# Patient Record
Sex: Male | Born: 1990 | Race: Black or African American | Hispanic: No | Marital: Married | Smoking: Current every day smoker
Health system: Southern US, Community
[De-identification: ages and names within clinical notes are randomized; demographics above are authoritative.]

---
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
## 2009-12-18 ENCOUNTER — Emergency Department (HOSPITAL_COMMUNITY): Admission: EM | Admit: 2009-12-18 | Discharge: 2009-12-18 | Payer: Self-pay | Admitting: Emergency Medicine

## 2010-03-11 ENCOUNTER — Emergency Department: Payer: Self-pay | Admitting: Emergency Medicine

## 2011-02-07 IMAGING — CT CT HEAD WITHOUT CONTRAST
2 series · 16 of 30 positions shown, 20 images · non-contrast
Comparison: none

REASON FOR EXAM: trauma
COMMENTS:

PROCEDURE:     CT  - CT HEAD WITHOUT CONTRAST  - March 11, 2010  [DATE]
RESULT:
TECHNIQUE: Noncontrast, emergent CT of the brain is performed in the
standard fashion.
There is no previous exam for comparison.

[Series 2: without · axial · non-contrast · 0.43mm/px · z∈[-170,-24]mm · 13 of 35 slices shown, 17 images]
[im 3/35  brain]
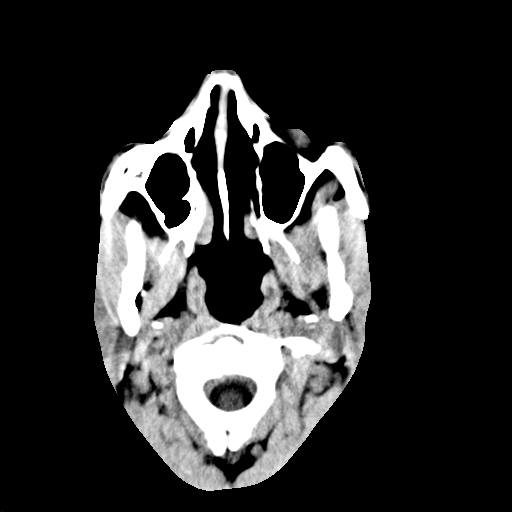
[im 3/35  bone]
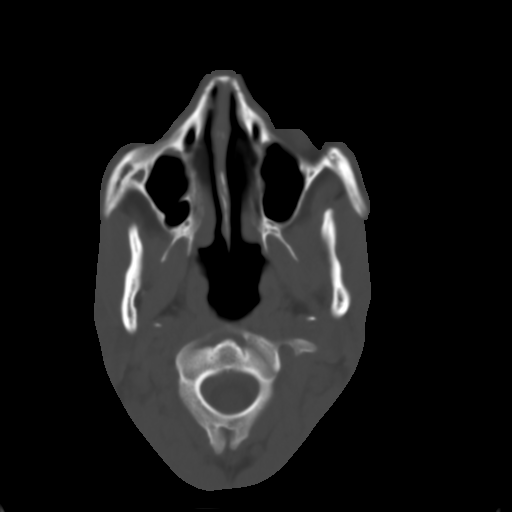
[im 5/35  brain]
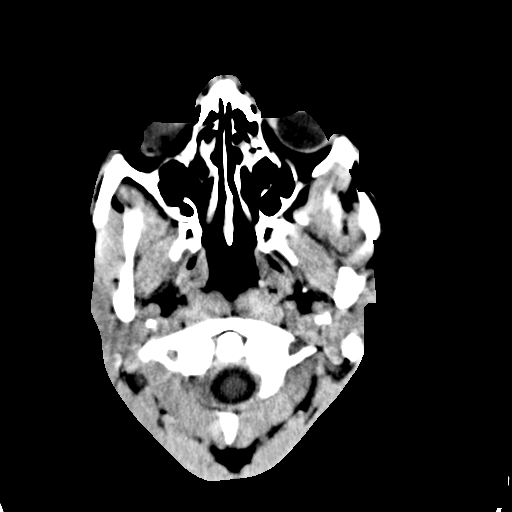
[im 8/35  brain]
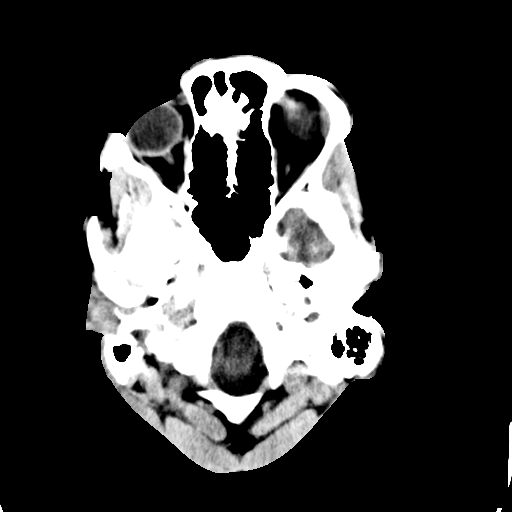
[im 10/35  brain]
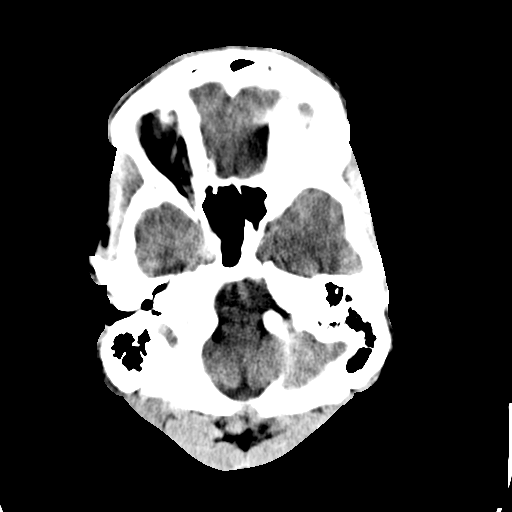
[im 13/35  brain]
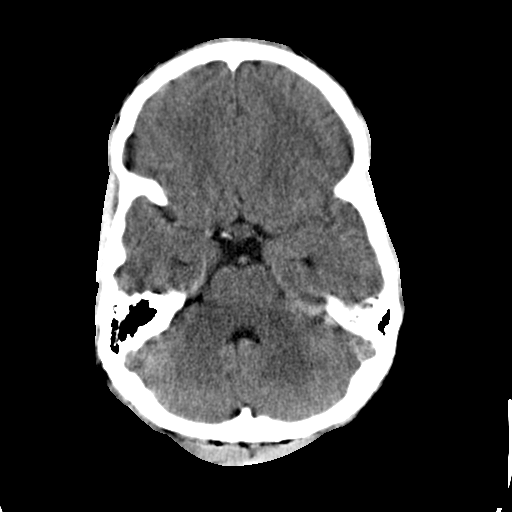
[im 13/35  bone]
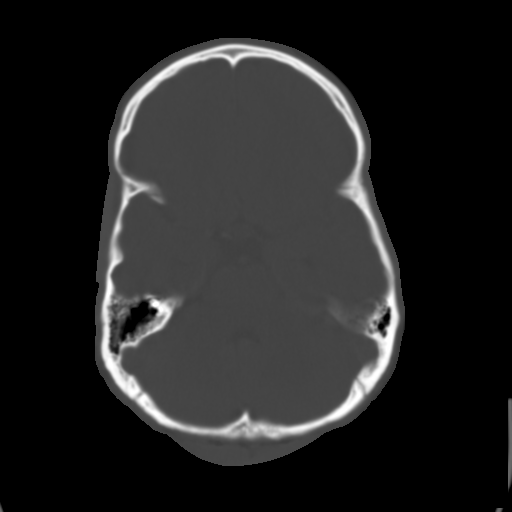
[im 15/35  brain]
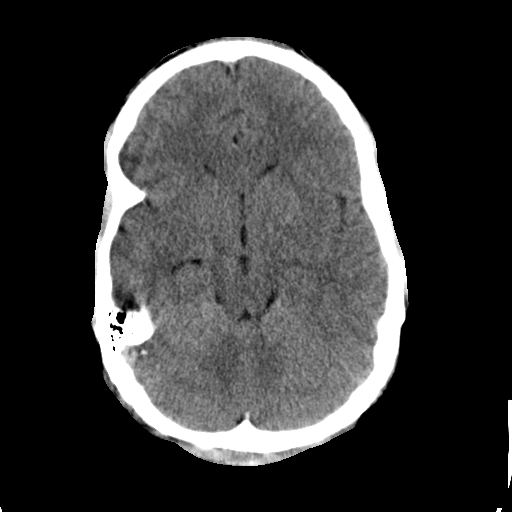
[im 18/35  brain]
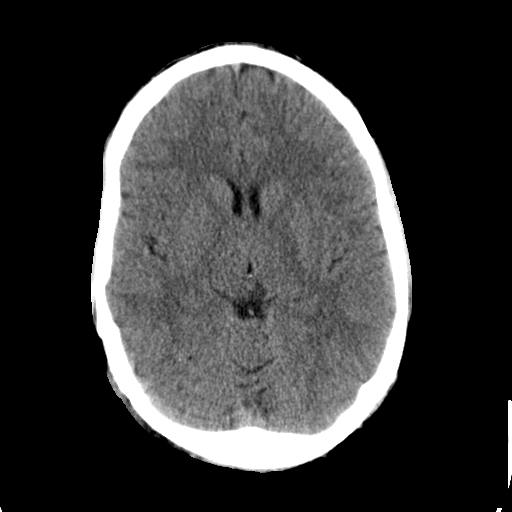
[im 20/35  brain]
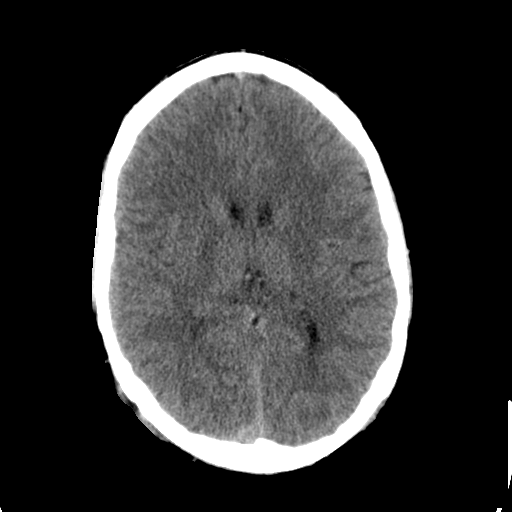
[im 22/35  brain]
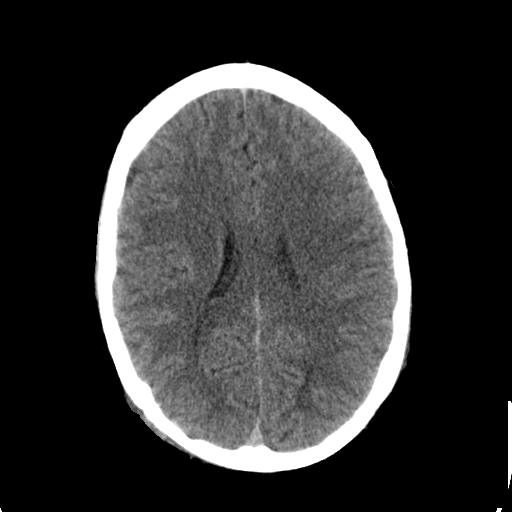
[im 22/35  bone]
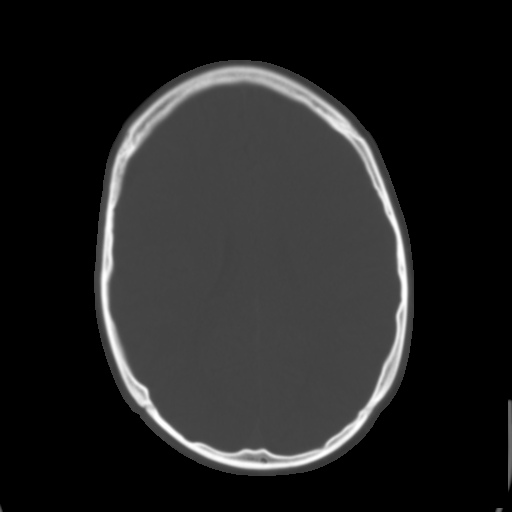
[im 25/35  brain]
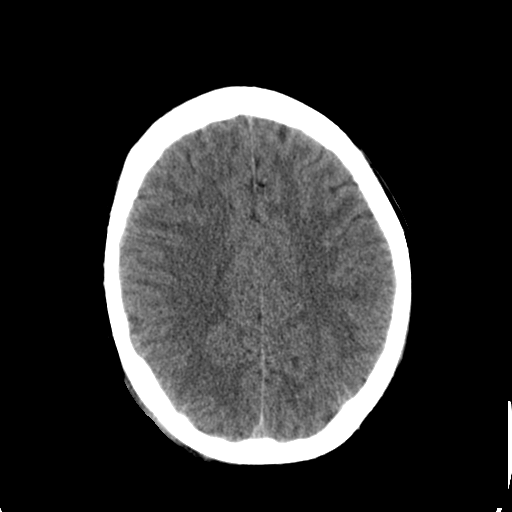
[im 27/35  brain]
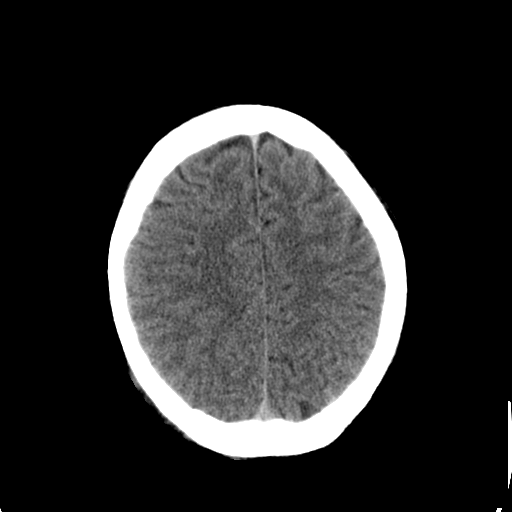
[im 30/35  brain]
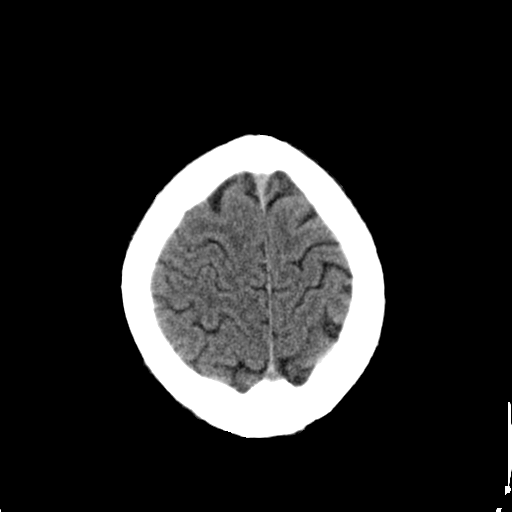
[im 32/35  brain]
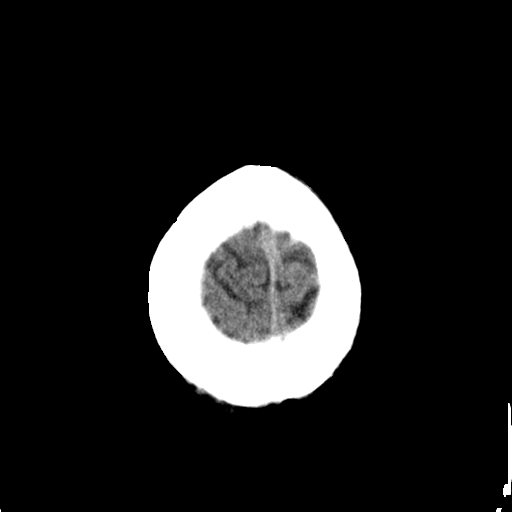
[im 32/35  bone]
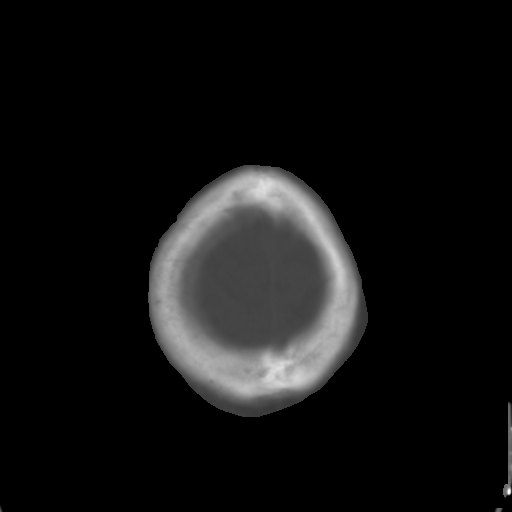

[Series 3: bone · axial · 0.43mm/px · z∈[-170,-120]mm · 3 of 35 slices shown]
[im 3/35  bone]
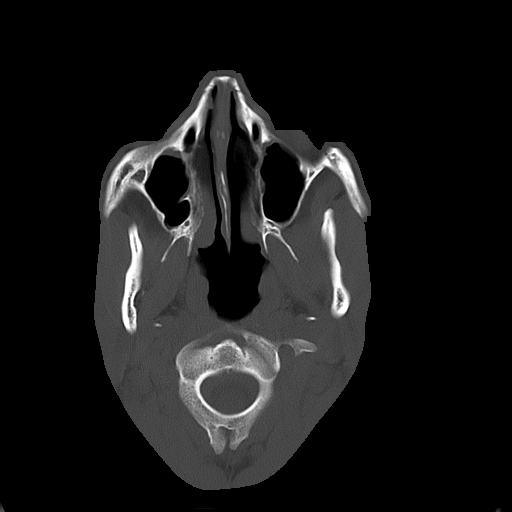
[im 8/35  bone]
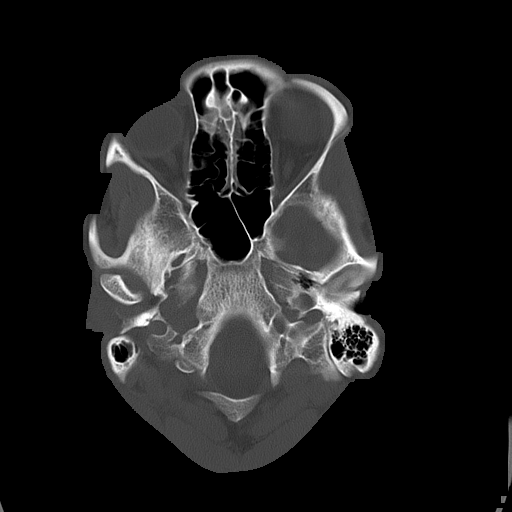
[im 13/35  bone]
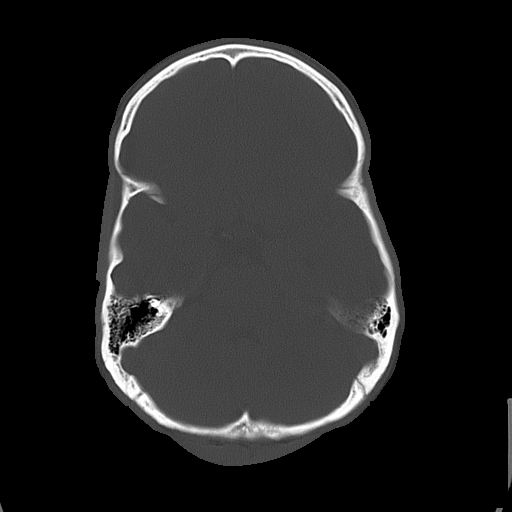

[16 of 30 positions shown; findings below may reference images not displayed]

FINDINGS: There is soft tissue swelling in the frontal region with possible
minimally displaced left nasal bone fracture. There is partial opacification
of the right maxillary sinus which is incompletely imaged. There is also
partial opacification of the ethmoid air cells. There is no air/fluid level
seen in the remaining sinuses or mastoids. The calvarium appears intact. The
ventricles and sulci are normal. There is no intracranial hemorrhage, mass
effect or midline shift.
IMPRESSION: Possible nasal bone fracture. Maxillofacial CT is available if
clinically necessary. Frontal hematoma. No skull fracture or acute
intracranial abnormality evident.

## 2014-08-08 ENCOUNTER — Emergency Department: Payer: Self-pay | Admitting: Student

## 2017-12-10 ENCOUNTER — Other Ambulatory Visit: Payer: Self-pay

## 2017-12-10 ENCOUNTER — Emergency Department: Payer: Self-pay

## 2017-12-10 ENCOUNTER — Emergency Department
Admission: EM | Admit: 2017-12-10 | Discharge: 2017-12-11 | Disposition: A | Discharge status: Home / Self Care | Payer: Self-pay | Attending: Student in an Organized Health Care Education/Training Program | Admitting: Student in an Organized Health Care Education/Training Program

## 2017-12-10 DIAGNOSIS — F17210 Nicotine dependence, cigarettes, uncomplicated: Secondary | ICD-10-CM | POA: Insufficient documentation

## 2017-12-10 DIAGNOSIS — R079 Chest pain, unspecified: Secondary | ICD-10-CM

## 2017-12-10 DIAGNOSIS — K2960 Other gastritis without bleeding: Secondary | ICD-10-CM | POA: Insufficient documentation

## 2017-12-10 MED ORDER — RANITIDINE HCL 150 MG PO TABS: 150.0000 mg | ORAL_TABLET | Freq: Two times a day (BID) | ORAL | 1 refills | Status: AC

## 2017-12-10 NOTE — ED Notes (Signed)
First Nurse Note:  Patient reports chest pain began 4 days ago and states it is worse with coughing.

## 2017-12-10 NOTE — ED Triage Notes (Signed)
Pt c/o central chest pain for the past 3-4 days. Denies cough or congestion states he did just get over a cold a little over a week ago..denies SOB or other sx..Marland Kitchen

## 2017-12-10 NOTE — ED Provider Notes (Signed)
[facility] Emergency Department Provider Note    First MD Initiated Contact with Patient 12/10/17 1607     (approximate)  I have reviewed the triage vital signs and the nursing notes.   HISTORY  Chief Complaint Chest Pain    HPI Jon Lucero is a 27 y.o. male he was a healthy young man with no recent hospitalizations presents to the ER with chief complaint of burning chest pain particular noted when waking up.  States the symptoms last for 2-3 hours.  No associated nausea diaphoresis shortness of breath.  No pleuritic chest pain.  States the pain is mild to moderate.  Never had pain like this before.  Did recently have upper respiratory illness and sinus infection.  No recent antibiotics.  Denies any vomiting.  No melena or hematochezia.  History reviewed. No pertinent past medical history. No family history on file. History reviewed. No pertinent surgical history. There are no active problems to display for this patient.     Prior to Admission medications   Medication Sig Start Date End Date Taking? Authorizing Provider  ranitidine (ZANTAC) 150 MG tablet Take 1 tablet (150 mg total) by mouth 2 (two) times daily. 12/10/17 12/10/18  Willy Eddyobinson, [place], MD    Allergies Patient has no known allergies.    Social History Social History   Tobacco Use  . Smoking status: Current Every Day Smoker    Types: Cigarettes  . Smokeless tobacco: Never Used  Substance Use Topics  . Alcohol use: Yes  . Drug use: Not on file    Review of Systems Patient denies headaches, rhinorrhea, blurry vision, numbness, shortness of breath, chest pain, edema, cough, abdominal pain, nausea, vomiting, diarrhea, dysuria, fevers, rashes or hallucinations unless otherwise stated above in HPI. ____________________________________________   PHYSICAL EXAM:  VITAL SIGNS: Vitals:   12/10/17 1526  BP: 132/81  Pulse: 92  Resp: 16  Temp: 99 F (37.2 C)  SpO2: 99%     Constitutional: Alert and oriented. Well appearing and in no acute distress. Eyes: Conjunctivae are normal.  Head: Atraumatic. Nose: No congestion/rhinnorhea. Mouth/Throat: Mucous membranes are moist.   Neck: No stridor. Painless ROM.  Cardiovascular: Normal rate, regular rhythm. Grossly normal heart sounds.  Good peripheral circulation. Respiratory: Normal respiratory effort.  No retractions. Lungs CTAB. Gastrointestinal: Soft and nontender. No distention. No abdominal bruits. No CVA tenderness. Genitourinary:  Musculoskeletal: No lower extremity tenderness nor edema.  No joint effusions. Neurologic:  Normal speech and language. No gross focal neurologic deficits are appreciated. No facial droop Skin:  Skin is warm, dry and intact. No rash noted. Psychiatric: Mood and affect are normal. Speech and behavior are normal.  ____________________________________________   LABS (all labs ordered are listed, but only abnormal results are displayed)  No results found for this or any previous visit (from the past 24 hour(s)). ____________________________________________  EKG My review and personal interpretation at Time: 15:21   Indication: chest pain  Rate: 90  Rhythm: sinus Axis: normal Other: normal intervals, no stemi ____________________________________________  RADIOLOGY  I personally reviewed all radiographic images ordered to evaluate for the above acute complaints and reviewed radiology reports and findings.  These findings were personally discussed with the patient.  Please see medical record for radiology report.  ____________________________________________   PROCEDURES  Procedure(s) performed:  Procedures    Critical Care performed: no ____________________________________________   INITIAL IMPRESSION / ASSESSMENT AND PLAN / ED COURSE  Pertinent labs & imaging results that were available during my  care of the patient were reviewed by me and considered in my  medical decision making (see chart for details).  DDX: Reflux, pericarditis, ACS PE, dissection  Jon Lucero is a 27 y.o. who presents to the ED with symptoms as described above.  EKG is nonischemic.  Patient low risk by Wells criteria and PERC negative.  Is not clinically consistent with ACS.  No signs or symptoms of pericarditis.  Not clinically consistent with dissection.  Abdominal exam is soft and benign.  Have high suspicion for reflux esophagitis and heartburn.  Will provide Zantac.  Discussed signs and symptoms which she should return to the ER.     As part of my medical decision making, I reviewed the following data within the electronic MEDICAL RECORD NUMBER Nursing notes reviewed and incorporated, Labs reviewed, notes from prior ED visits and [place] Controlled Substance Database   ____________________________________________   FINAL CLINICAL IMPRESSION(S) / ED DIAGNOSES  Final diagnoses:  Chest pain, unspecified type  Reflux gastritis      NEW MEDICATIONS STARTED DURING THIS VISIT:  New Prescriptions   RANITIDINE (ZANTAC) 150 MG TABLET    Take 1 tablet (150 mg total) by mouth 2 (two) times daily.     Note:  This document was prepared using Dragon voice recognition software and may include unintentional dictation errors.    Willy Eddy, MD 12/10/17 [phone]

## 2017-12-11 NOTE — ED Notes (Signed)
Delay in charting. Pt left the ED at 1734 on 12/10/17 in NAD.

## 2018-11-08 IMAGING — CR DG CHEST 2V
1 series · 2 of 2 positions shown · non-contrast
Comparison: None.

CLINICAL DATA: Recent cough with chest pain

EXAM:
CHEST - 2 VIEW

[Series 1: dg chest 2 view · 0.14mm/px · 2 of 2 slices shown]
[im 1/2]
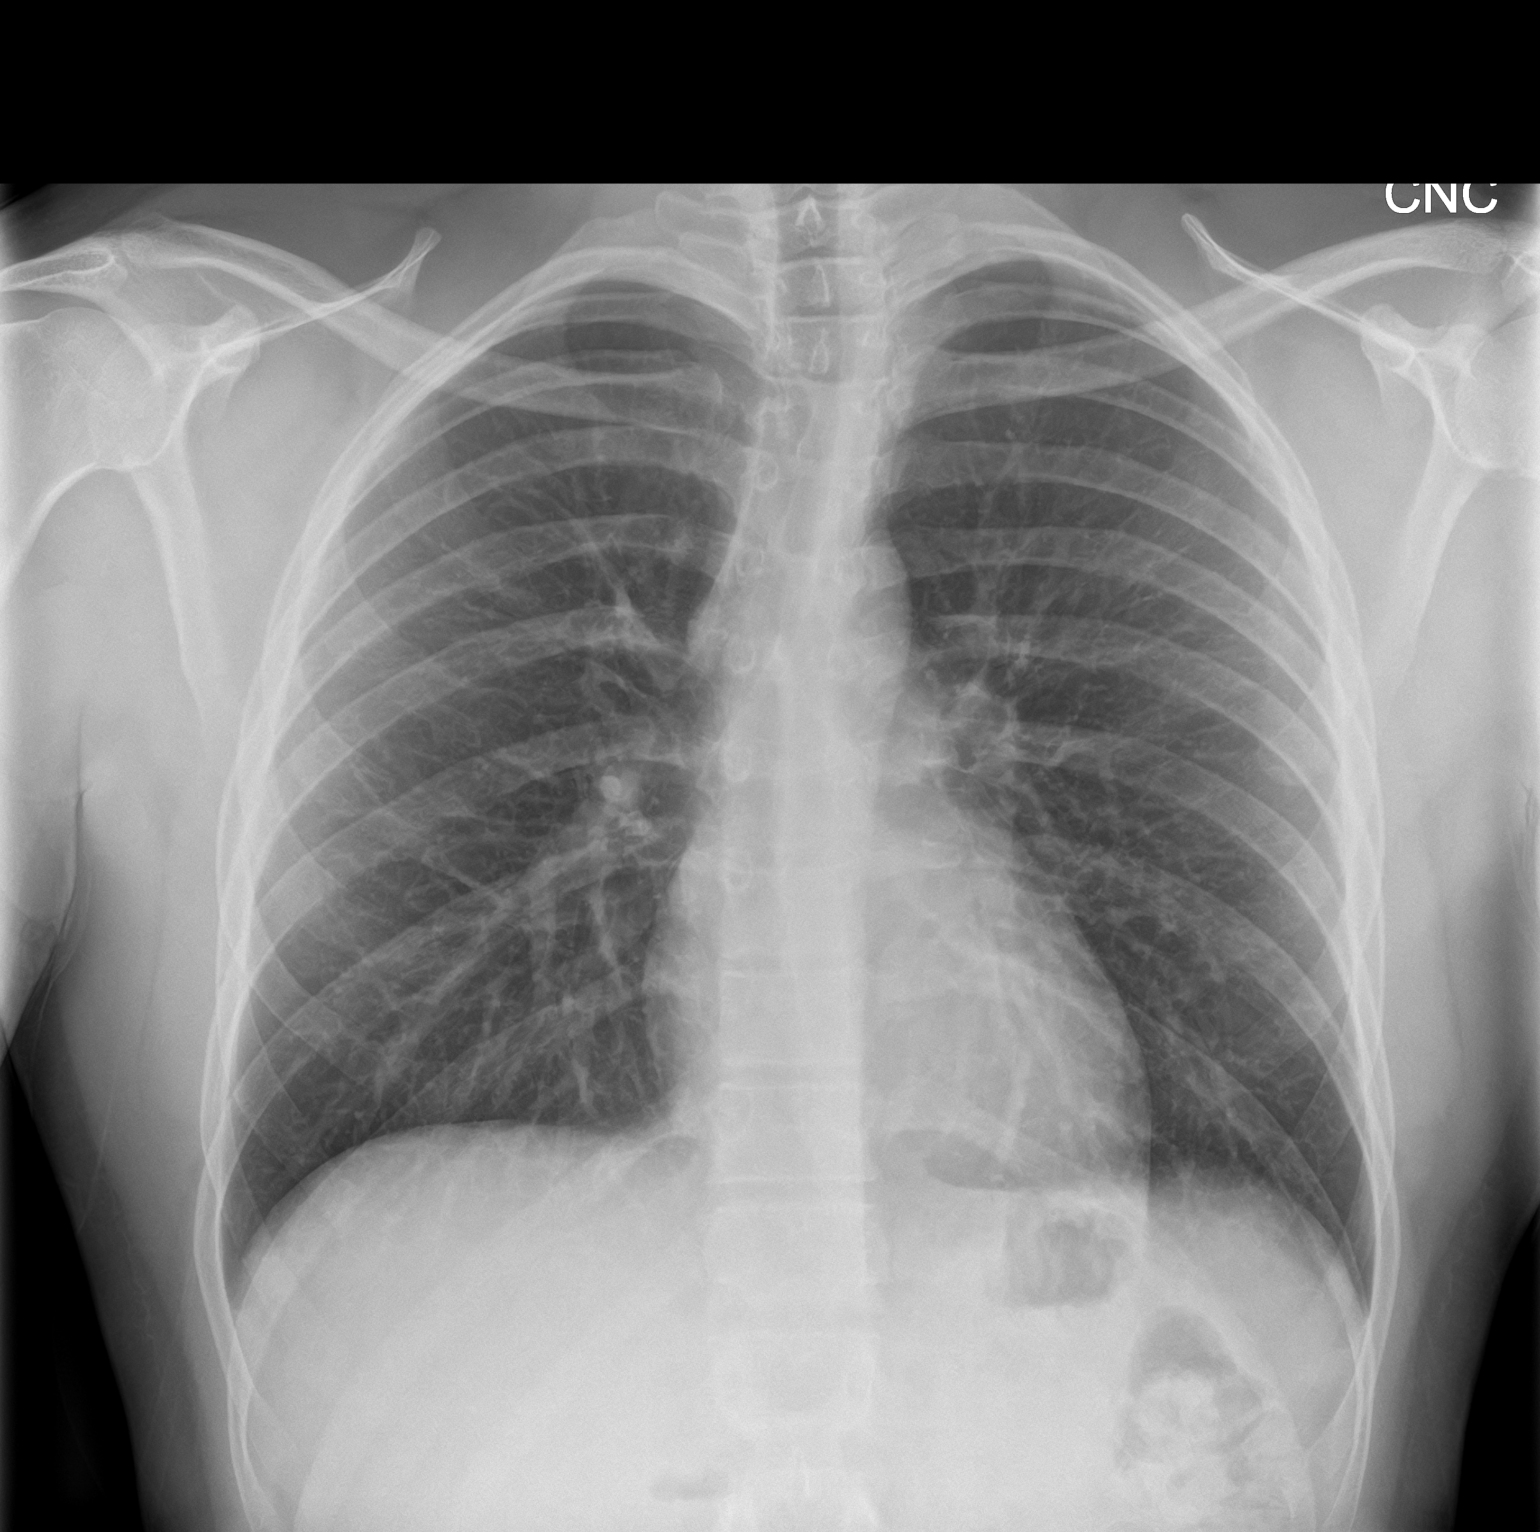
[im 2/2]
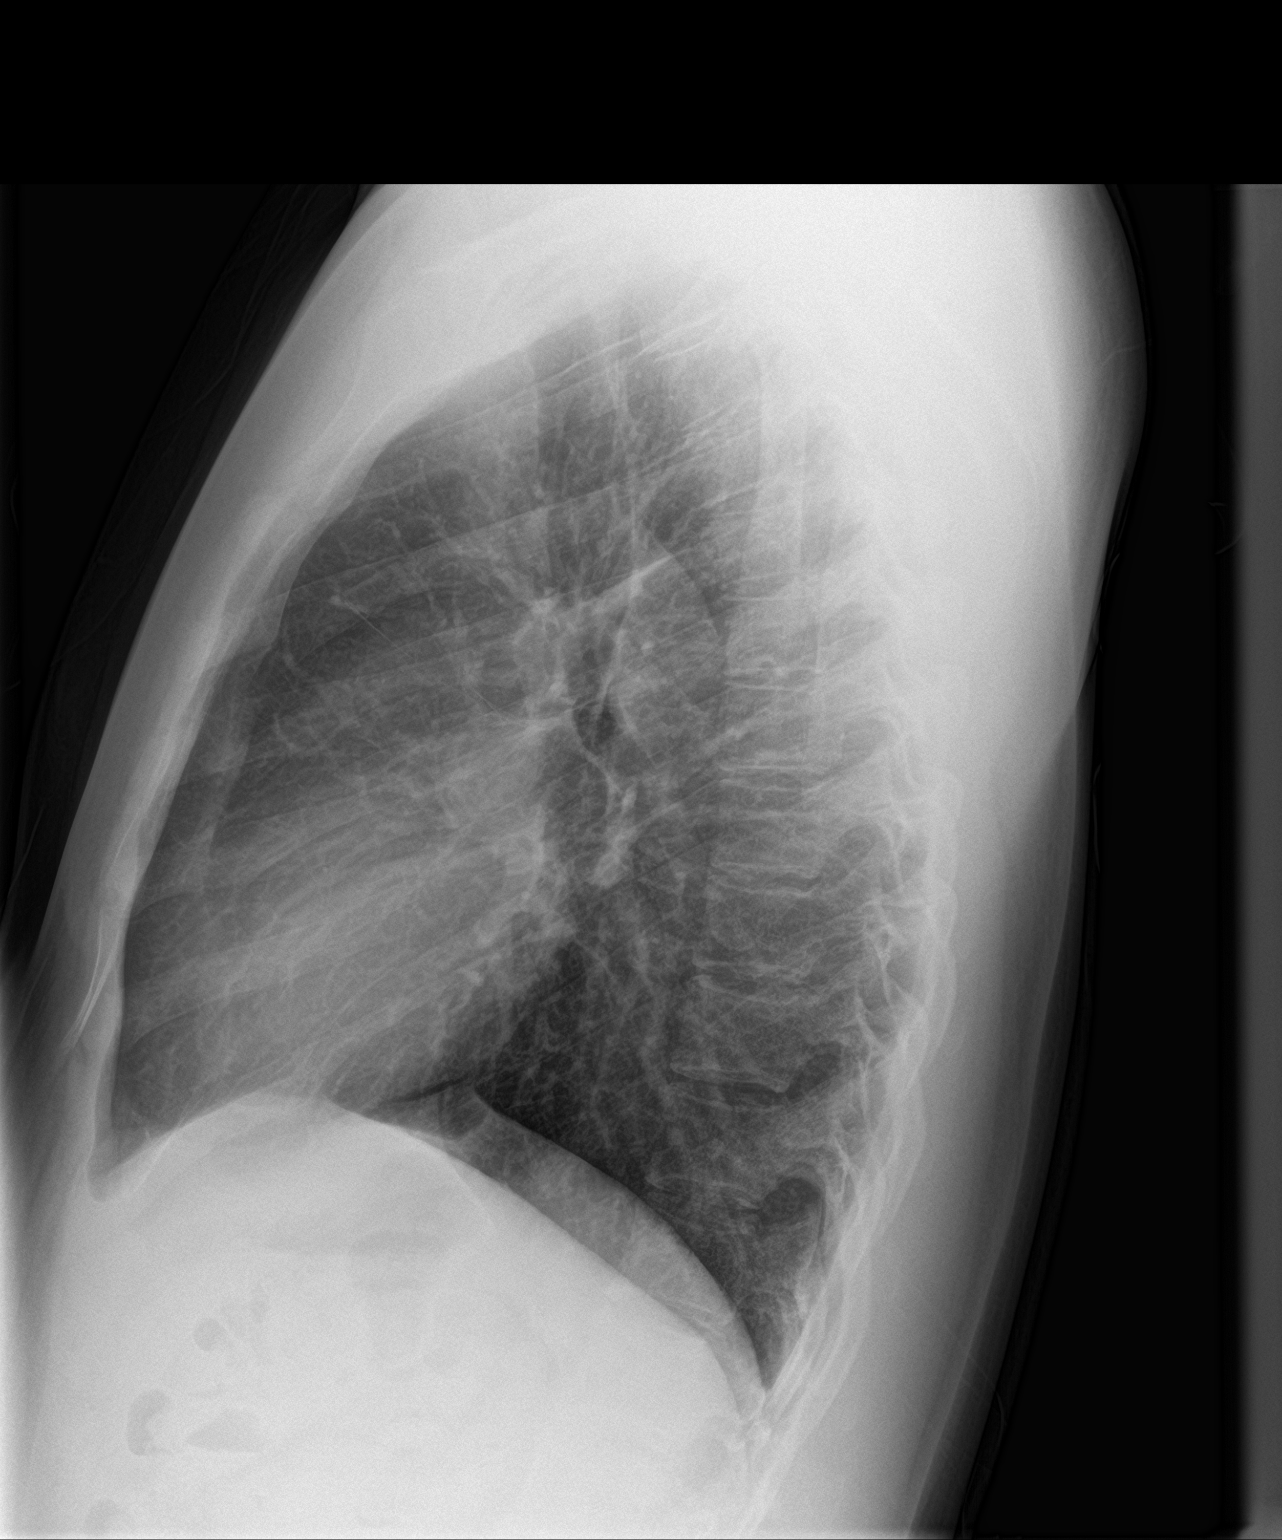

[2 of 2 positions shown; findings below may reference images not displayed]

FINDINGS: The heart size and mediastinal contours are within normal limits.
Both lungs are clear. The visualized skeletal structures are
unremarkable.
IMPRESSION: No active cardiopulmonary disease.

## 2019-03-31 ENCOUNTER — Other Ambulatory Visit: Payer: Self-pay

## 2019-03-31 ENCOUNTER — Ambulatory Visit (LOCAL_COMMUNITY_HEALTH_CENTER): Payer: Self-pay

## 2019-03-31 DIAGNOSIS — Z202 Contact with and (suspected) exposure to infections with a predominantly sexual mode of transmission: Secondary | ICD-10-CM

## 2019-03-31 DIAGNOSIS — Z113 Encounter for screening for infections with a predominantly sexual mode of transmission: Secondary | ICD-10-CM

## 2019-03-31 MED ORDER — AZITHROMYCIN 500 MG PO TABS
1000.0000 mg | ORAL_TABLET | Freq: Once | ORAL | Status: AC
  Administered 2019-03-31: 1000 mg via ORAL

## 2019-03-31 NOTE — Progress Notes (Signed)
Patient here today as a contact to Chlamydia. Declines all testing and screening. Requests treatment as a contact only.  Hal Morales, RN Patient treated per provider orders.  Hal Morales, RN

## 2019-03-31 NOTE — Progress Notes (Signed)
    STI clinic/screening visit  Subjective:  Jon Lucero is a 28 y.o. male being seen today for an STI screening visit. The patient reports they do not have symptoms.  Patient has the following medical conditions:  There are no active problems to display for this patient.    Chief Complaint  Patient presents with  . Exposure to STD    HPI  Patient reports contact to chlamydia.  Partner has been treated  See flowsheet for further details and programmatic requirements.    The following portions of the patient's history were reviewed and updated as appropriate: allergies, current medications, past medical history, past social history, past surgical history and problem list.  Objective:  There were no vitals filed for this visit.  Physical Exam client declines exam    Assessment and Plan:  Jon Lucero is a 28 y.o. male [facility]   STS screen Treat as contact to Chlamydia with Azithromycin 1000 mg po STAT. Abstain from sex x 1 wk and use condoms always.   No follow-ups on file.  No future appointments.  Hassell Done, FNP

## 2019-06-10 ENCOUNTER — Ambulatory Visit: Payer: Self-pay

## 2019-08-07 ENCOUNTER — Ambulatory Visit: Payer: Self-pay
